# Patient Record
Sex: Female | Born: 1970 | Race: White | Hispanic: No | Marital: Married | Smoking: Never smoker
Health system: Southern US, Community
[De-identification: ages and names within clinical notes are randomized; demographics above are authoritative.]

---

## 2007-07-02 ENCOUNTER — Ambulatory Visit (HOSPITAL_COMMUNITY): Admission: RE | Admit: 2007-07-02 | Discharge: 2007-07-02 | Payer: Self-pay | Admitting: Obstetrics and Gynecology

## 2007-07-05 ENCOUNTER — Inpatient Hospital Stay (HOSPITAL_COMMUNITY): Admission: AD | Admit: 2007-07-05 | Discharge: 2007-07-05 | Payer: Self-pay | Admitting: *Deleted

## 2008-04-08 ENCOUNTER — Inpatient Hospital Stay (HOSPITAL_COMMUNITY): Admission: AD | Admit: 2008-04-08 | Discharge: 2008-04-08 | Payer: Self-pay | Admitting: Obstetrics and Gynecology

## 2008-04-21 ENCOUNTER — Inpatient Hospital Stay (HOSPITAL_COMMUNITY): Admission: RE | Admit: 2008-04-21 | Discharge: 2008-04-24 | Payer: Self-pay | Admitting: Obstetrics and Gynecology

## 2008-04-21 ENCOUNTER — Encounter (INDEPENDENT_AMBULATORY_CARE_PROVIDER_SITE_OTHER): Payer: Self-pay | Admitting: Obstetrics and Gynecology

## 2008-06-07 IMAGING — RF DG HYSTEROGRAM
6 series · 6 of 6 positions shown · IV contrast (omnipaque)
Comparison: No prior studies are available for comparison

Clinical Data  primary  infertility

HYSTEROSALPINGOGRAM
TECHNIQUE: Following cleansing of the cervix and vagina with
Betadine solution, a hysterosalpingogram was performed using a 5-
French hysterosalpingogram catheter and Omnipaque 300 contrast.

[Series 1: run · 1 of 1 slices shown (1 of 6)]
[im 1/1]
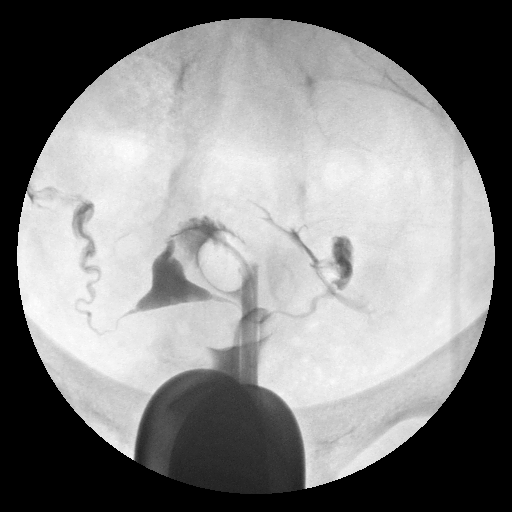

[Series 2: run · 1 of 1 slices shown (2 of 6)]
[im 1/1]
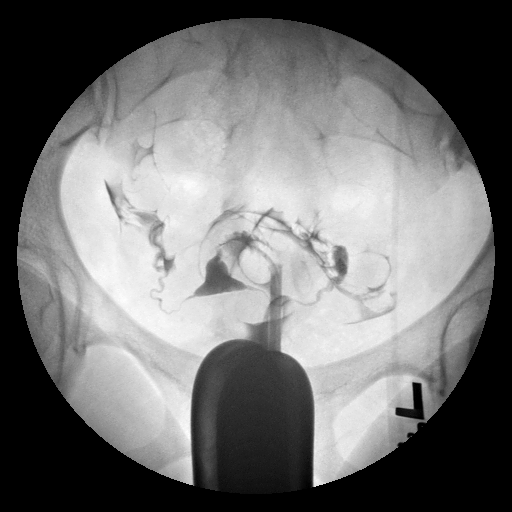

[Series 3: run · 1 of 1 slices shown (3 of 6)]
[im 1/1]
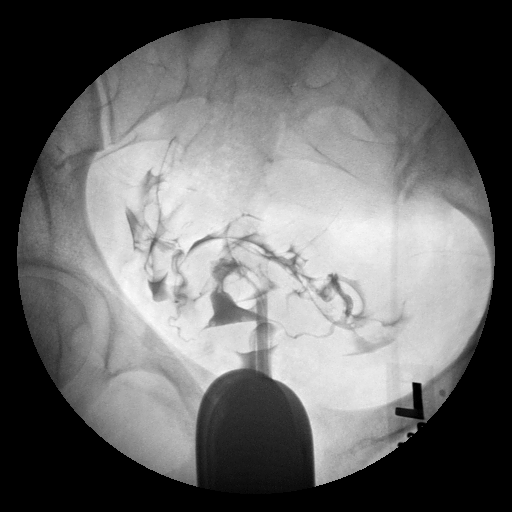

[Series 4: run · 1 of 1 slices shown (4 of 6)]
[im 1/1]
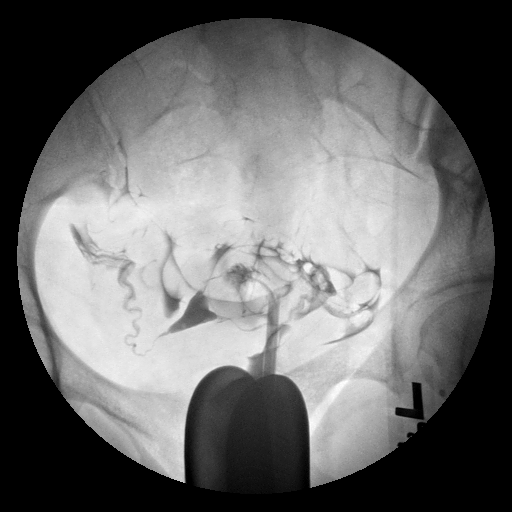

[Series 5: run · 1 of 1 slices shown (5 of 6)]
[im 1/1]
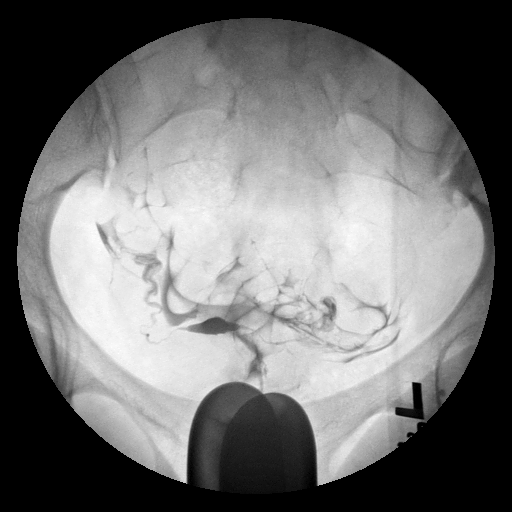

[Series 6: run · 1 of 1 slices shown (6 of 6)]
[im 1/1]
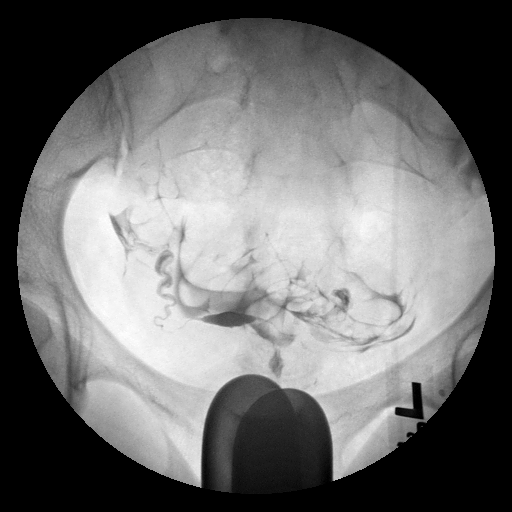

[6 of 6 positions shown; findings below may reference images not displayed]

FINDINGS: A normal endometrial morphology is seen.  Both fallopian
tubes have a normal appearance and bilateral free intraperitoneal
spill was noted.  No evidence for loculation of contrast was seen
to suggest the presence of peritubal or  periovarian adhesions.
IMPRESSION: Normal HSG

## 2010-07-09 ENCOUNTER — Encounter (HOSPITAL_COMMUNITY)
Admission: RE | Admit: 2010-07-09 | Discharge: 2010-07-09 | Disposition: A | Discharge status: Home / Self Care | Payer: BC Managed Care – PPO | Source: Ambulatory Visit | Attending: Obstetrics and Gynecology | Admitting: Obstetrics and Gynecology

## 2010-07-09 LAB — CBC
Hemoglobin: 12.5 g/dL (ref 12.0–15.0)
Platelets: 184 10*3/uL (ref 150–400)
RBC: 4.02 MIL/uL (ref 3.87–5.11)

## 2010-07-09 LAB — RPR: RPR Ser Ql: NONREACTIVE

## 2010-07-10 LAB — SURGICAL PCR SCREEN: Staphylococcus aureus: NEGATIVE

## 2010-07-16 ENCOUNTER — Inpatient Hospital Stay (HOSPITAL_COMMUNITY)
Admission: RE | Admit: 2010-07-16 | Discharge: 2010-07-18 | DRG: 371 | Disposition: A | Discharge status: Home / Self Care | Payer: BC Managed Care – PPO | Source: Ambulatory Visit | Attending: Obstetrics and Gynecology | Admitting: Obstetrics and Gynecology

## 2010-07-16 DIAGNOSIS — O34219 Maternal care for unspecified type scar from previous cesarean delivery: Principal | ICD-10-CM | POA: Diagnosis present

## 2010-07-16 DIAGNOSIS — O09529 Supervision of elderly multigravida, unspecified trimester: Secondary | ICD-10-CM | POA: Diagnosis present

## 2010-07-16 DIAGNOSIS — Z302 Encounter for sterilization: Secondary | ICD-10-CM

## 2010-07-17 LAB — CBC
MCH: 30.9 pg (ref 26.0–34.0)
Platelets: 153 10*3/uL (ref 150–400)
RBC: 3.53 MIL/uL — ABNORMAL LOW (ref 3.87–5.11)
RDW: 13 % (ref 11.5–15.5)
WBC: 10.2 10*3/uL (ref 4.0–10.5)

## 2010-07-17 LAB — ABO/RH: ABO/RH(D): O POS

## 2010-07-23 LAB — RPR: RPR Ser Ql: NONREACTIVE

## 2010-07-23 LAB — CBC
Hemoglobin: 12.5 g/dL (ref 12.0–15.0)
MCHC: 34.6 g/dL (ref 30.0–36.0)
MCV: 95.8 fL (ref 78.0–100.0)
Platelets: 152 10*3/uL (ref 150–400)
RBC: 3.82 MIL/uL — ABNORMAL LOW (ref 3.87–5.11)
RBC: 2.57 MIL/uL — ABNORMAL LOW (ref 3.87–5.11)
RDW: 12.5 % (ref 11.5–15.5)
WBC: 15.5 10*3/uL — ABNORMAL HIGH (ref 4.0–10.5)

## 2010-07-23 LAB — GLUCOSE, CAPILLARY: Glucose-Capillary: 97 mg/dL (ref 70–99)

## 2010-07-27 NOTE — Discharge Summary (Signed)
  NAMESUBRINA, Walters            ACCOUNT NO.:  [id number]  MEDICAL RECORD NO.:  [id number]           PATIENT TYPE:  I  LOCATION:  9106                          [facility]ACILITY[facility]  [facility]  PHYSICIAN:  Lenoard Aden, M.D.DATE OF BIRTH:  [birth year]  DATE OF ADMISSION:  07/16/2010 DATE OF DISCHARGE:  07/19/2010                              DISCHARGE SUMMARY   ADMITTING DIAGNOSES:  65 weeks' gestation, previous cesarean section, undesired fertility, elective repeat cesarean section.  DISCHARGE DIAGNOSES:  Postoperative day #2 status post repeat cesarean section, lysis of adhesions, and tubal sterilization with Filshie clips.  HISTORY:  The patient is a 40 year old gravida 2, para 1 with an EDC of July 23, 2010.  The patient has received prenatal care at [facility] with Dr. Billy Walters as primary since 8 weeks' gestation.  PRENATAL LABS:  Blood type O positive, rubella positive, HIV negative, RPR negative, hepatitis B negative, normal GTT.  Pregnancy is complicated by previous cesarean section for failure to progress and a single kidney with recurrent UTIs.  The patient has a known allergy to PERCOCET and DOXYCYCLINE.  CURRENT MEDICATIONS:  Prenatal vitamin 1 tablet p.o. daily and Tylenol 1- 2 tablets p.r.n. q.4 h. as needed for discomfort.  MEDICAL HISTORY:  Significant for a single kidney, history of nephrectomy as an infant, recurrent UTIs, infertility.  PRESENTATION:  The patient presents on day of admission for repeat cesarean section and tubal sterilization.  Preoperative CBC; white blood cell count 9.3, hemoglobin 12.5, hematocrit 36.4, and platelet count of 184.    PROCEDURE: The patient undergoes cesarean section attended by Dr. Billy Walters with delivery of a female 7 pounds 7 ounces, Apgar scores of 8 and 9, lysis of adhesions, tubal sterilization with Filshie clips secondary to tubes to uterine wall.  See operative report for further details.   POSTOPERATIVE COURSE: The  patient's postoperative course was uneventful.  Postoperative CBC; white blood cell count 10.2, hemoglobin 10.9, hematocrit 32.5, and platelet count of 153. Physical exam was within normal limits.  Incision is well approximated with staples. No erythema, no ecchymosis, and no drainage. Vital signs were stable. The patient remains afebrile during hospitalization.  The patient is discharged home on postoperative day #2 in stable condition.  DISCHARGE INSTRUCTIONS:  Postpartum instructions per [facility] booklet.  ACTIVITY:  Postoperative restrictions x2 weeks.  DIET:  Regular.  MEDICATIONS:  Prenatal vitamin 1 tablet p.o. daily, ibuprofen 800 mg every 8 hours as needed for discomfort, and Ultram 25 mg 1-2 tablets every 4 hours as needed for pain.  The patient to return to [facility] on Friday, July 20, 2010, for staple removal and at 6 weeks for routine postpartum care.     Marlinda Mike, C.N.M.   ______________________________ Lenoard Aden, M.D.    TB/MEDQ  D:  07/18/2010  T:  07/19/2010  Job:  [id number]  Electronically Signed by Marlinda Mike C.N.M. on 07/25/2010 09:31:13 AM Electronically Signed by Olivia Mackie M.D. on 07/27/2010 03:18:29 PM

## 2010-07-27 NOTE — Op Note (Signed)
Emily Walters, Emily Walters            ACCOUNT NO.:  [id number]  MEDICAL RECORD NO.:  [id number]           PATIENT TYPE:  I  LOCATION:  9106                          [facility]ILITY[facility]  PHYSICIAN:  Lenoard Aden, M.D.DATE OF BIRTH:  1970/10/22  DATE OF PROCEDURE:  07/16/2010 DATE OF DISCHARGE:                              OPERATIVE REPORT   PREOPERATIVE DIAGNOSES: 1. A 39-week intrauterine pregnancy. 2. Previous C-sections, for repeat. 3. Elective sterilization.  POSTOPERATIVE DIAGNOSES: 1. A 39-week intrauterine pregnancy. 2. Previous C-sections, for repeat. 3. Elective sterilization. 4. Dense pelvic adhesions.  PROCEDURES: 1. Repeat low segment transverse cesarean section. 2. Lysis of adhesions. 3. Tubal ligation with Filshie clips.  SURGEON:  Lenoard Aden, MD  ASSISTANT:  Marlinda Mike, CNM  ANESTHESIA:  Spinal by Dr. Brayton Caves.  ESTIMATED BLOOD LOSS:  800 mL.  COMPLICATIONS:  None.  DRAINS:  Foley. COUNTS:  Correct.  DISPOSITION:  The patient to recovery in good condition.  BRIEF OPERATIVE NOTE:  After being apprised of the risks of anesthesia, infection, bleeding, injury of abdominal organs, possible need for repair, delayed versus immediate complications to include bowel and bladder injury with possible need for repair, failure risk of tubal ligation of 5-10 per 1000.  The patient was brought to the operating room where she administered a spinal anesthetic without complications. She was prepped and draped in the usual sterile fashion.  Foley catheter was placed.  After achieving adequate anesthesia, a time-out was accomplished in a standard fashion and dilute Marcaine solution was placed.  Pfannenstiel skin was incision made with a scalpel and carried down to the fascia which nicked in the midline and opened transversely using Mayo scissors.  Upon opening the fascia, it was noted that there is a significant rectus diastasis with adhesions of the  bladder flap and uterus to the anterior abdominal wall.  These were lysed sharply using Metzenbaum scissors and bladder blade was then placed and bladder flap was then further dissected off the lower uterine segment.  Upon further dissection, there was a lower uterine segment window which was noted upon dissection of the bladder flap.  Bladder flap was more meticulously dissected off this lower uterine segment caudad to the area where the window was and then the window was extended using bandage scissors, amniotomy clear fluid, atraumatic delivery of a full-term living female as noted, Apgars as noted.  Placenta was delivered from posterior location intact and exterior uterus was exteriorized and curetted using dry lap pack and closed in one running imbricating layer of 0 Monocryl suture. Bladder flap was inspected and found to be hemostatic.  Irrigation was accomplished.  Urine output was adequate.  Urine was clear.  Both tubes were significantly adherent to the lower uterine segment such that a well-defined portion of the mesosalpinx was not seen on the infundibular portion of the tube.  There were small windows that appeared closer to the isthmic portion of the tube into the ampullary section.  These ampullary isthmic portions of the tube were identified, where a small window of the mesosalpinx was noted.  Filshie clips were placed across the tubes at both ampullary  isthmic portions without difficulty.  Good hemostasis was noted.  Both ovaries appeared normal.  Good hemostasis along the lower uterine incision was noted.  Bladder flap was once again inspected and found to be hemostatic.  Irrigation was accomplished and uterus was replaced in the abdominal cavity.  The fascia was then closed using 0 Monocryl in a continuous running fashion.  Subcutaneous tissues were reapproximated using 0 plain in a continuous running fashion.  Skin was closed using staples.  The patient tolerated the  procedure well and was transferred to recovery in good condition.     Lenoard Aden, M.D.     RJT/MEDQ  D:  07/16/2010  T:  07/17/2010  Job:  [id number]  Electronically Signed by Olivia Mackie M.D. on 07/27/2010 04:54:09 PM

## 2010-07-27 NOTE — H&P (Addendum)
  Emily Walters, Emily Walters            ACCOUNT NO.:  [id number]  MEDICAL RECORD NO.:  [id number]           PATIENT TYPE:  O  LOCATION:  SDC                           [facility]  PHYSICIAN:  Lenoard Aden, M.D.DATE OF BIRTH:  [birth year]  DATE OF ADMISSION:  07/09/2010 DATE OF DISCHARGE:  07/09/2010                             HISTORY & PHYSICAL   CHIEF COMPLAINT:  Previous C-section repeat at 39 weeks.  HISTORY OF PRESENT ILLNESS:  She is a 40 year old white female G 2, P1 history of previous C-section in 2010 who presents now for elective repeat C-section at 39 weeks with tubal ligation.  MEDICATIONS:  Prenatal vitamins, Mucinex as needed, Claritin as needed.  ALLERGIES:  PERCOCET, DOXYCYCLINE, and no latex allergies.  FAMILY HISTORY:  Stroke, heart disease, kidney cancer, depression, breast cancer, diabetes.  SOCIAL HISTORY:  Noncontributory.  She is a nonsmoker, nondrinker.  She denies domestic or physical violence.  SURGICAL HISTORY:  She had a primary C-section for a failed induction in 2010.  She has a history of Achilles tendon repair in 1988.  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  Her renal function tests during pregnancy have remained normal with a normal 24-hour urine.  PHYSICAL EXAMINATION:  VITAL SIGNS:  She has a height of 5 feet 3/4, weight of 210 pounds, blood pressure 118/70. HEENT:  Normal. NECK:  Supple.  Full range of motion. LUNGS:  Clear. HEART:  Regular rate and rhythm. ABDOMEN:  Soft, gravid, nontender.  No CVA tenderness. GU:  Cervix is closed, 70% vertex -1. EXTREMITIES:  No cords. NEUROLOGIC:  Nonfocal. SKIN:  Intact.  IMPRESSION: 1. A 39-week intrauterine pregnancy. 2. Previous cesarean section for elective repeat tubal ligation.  PLAN:  Proceed with elective repeat C-section and tubal ligation.  Risks of anesthesia, infection, bleeding, injury to intraabdominal organs and need  for repair was discussed.  Delayed versus immediate complications to include bowel and bladder injury noted.  Fair risk with tubal ligation of __________ was also noted.  The patient acknowledges and will proceed.     Lenoard Aden, M.D.     RJT/MEDQ  D:  07/14/2010  T:  07/14/2010  Job:  [id number]  cc:   Ma Hillock  Electronically Signed by Olivia Mackie M.D. on 07/27/2010 03:18:12 PM

## 2010-08-07 ENCOUNTER — Inpatient Hospital Stay (HOSPITAL_COMMUNITY): Admission: AD | Admit: 2010-08-07 | Payer: Self-pay | Source: Home / Self Care | Admitting: Obstetrics and Gynecology

## 2010-08-21 NOTE — H&P (Signed)
Emily Walters, Emily Walters            ACCOUNT NO.:  [id number]   MEDICAL RECORD NO.:  [id number]          PATIENT TYPE:  INP   LOCATION:  9161                          [facility]   PHYSICIAN:  Lenoard Aden, M.D.DATE OF BIRTH:  20-Feb-1971   DATE OF ADMISSION:  04/21/2008  DATE OF DISCHARGE:                              HISTORY & PHYSICAL   CHIEF COMPLAINT:  A 39 weeks with oligohydramnios and an AFI of 5.   She is a 40 year old white female G1, P0 at 39 plus weeks with an AFI of  5 as seen on ultrasound who presents with a favorable cervix for  induction at 39 weeks appropriate for gestational age fetus.   ALLERGIES:  She has no known drug allergies.   MEDICATIONS:  Prenatal vitamins.   SOCIAL HISTORY:  She is a nonsmoker, nondrinker.  She denies domestic  physical violence.   FAMILY HISTORY:  Depression, stroke, diabetes, breast and kidney cancer,  heart disease, chronic hypertension.  This is her first pregnancy.  GBS  was negative.   PHYSICAL EXAMINATION:  GENERAL:  She is a well-developed, well-nourished  white female in no acute distress.  HEENT:  Normal.  LUNGS:  Clear.  HEART:  Regular rhythm.  ABDOMEN:  Soft, gravid, and nontender.  Estimated fetal weight 7-1/2  pounds, cervix is 2-3, 80% vertex, -1.  EXTREMITIES:  There are no cords.  NEUROLOGIC:  Nonfocal.  SKIN:  Intact.   IMPRESSION:  Oligohydramnios at term with favorable cervix for  induction.   PLAN:  To proceed with Pitocin, epidural as needed.      Lenoard Aden, M.D.  Electronically Signed     RJT/MEDQ  D:  04/21/2008  T:  04/21/2008  Job:  [id number]

## 2010-08-21 NOTE — Op Note (Signed)
NAMEJEANNEMARIE, Emily Walters            ACCOUNT NO.:  [id number]   MEDICAL RECORD NO.:  [id number]          PATIENT TYPE:  INP   LOCATION:  9102                          [facility]   PHYSICIAN:  Lenoard Aden, M.D.DATE OF BIRTH:  [birth year]   DATE OF PROCEDURE:  DATE OF DISCHARGE:                               OPERATIVE REPORT   PREOPERATIVE DIAGNOSIS:  Active phase arrest.   POSTOPERATIVE DIAGNOSES:  Active phase arrest plus occiput posterior  position.   PROCEDURE:  Primary low-segment transverse cesarean section.   SURGEON:  Lenoard Aden, MD   ANESTHESIA:  Epidural by Hatchett.   ESTIMATED BLOOD LOSS:  1000 mL.   COMPLICATIONS:  None.   DRAINS:  Foley.   COUNTS:  Correct.   The patient to recovery in good condition.   BRIEF OPERATIVE NOTE:  After being apprised of risks of anesthesia,  infection, bleeding, injury to abdominal organs, need for repair,  delayed versus immediate complications to include bowel and bladder  injury, the patient was brought to the operating room where she was  administered a dosing of epidural anesthetic without complications.  She  was prepped and draped in usual sterile fashion.  Foley catheter placed.  After achieving adequate anesthesia, dilute Marcaine solution was  placed.  Pfannenstiel skin incision made with a scalpel, carried down to  fascia, which was nicked in midline and opened transversely using Mayo  scissors.  Rectus muscles dissected sharply in the midline, peritoneum  entered sharply.  Bladder blade placed.  Visceral peritoneum scored  sharply off the lower uterine segment.  Kerr hysterotomy incision was  made.  Atraumatic delivery.  Thin meconium.  Occiput posterior position,  6 pounds 4 ounces female, pediatrician in attendance, partial anterior  placental abruption noted.  Placenta delivered manually intact.  Uterus  exteriorized, curetted using a dry lap pack and closed in 2 running  imbricating layers of  0-Monocryl suture with multiple interrupted  sutures in the midline for hemostasis.  Bladder flap inspected  and found to be hemostatic.  Irrigation accomplished.  Fascia closed  using 0-Monocryl in a continuous running fashion.  Subcutaneous tissue  reapproximated using a 2-0 plain in a continuous running fashion.  Skin  closed using staples.  The patient tolerated the procedure well and was  transferred to recovery in good condition.      Lenoard Aden, M.D.  Electronically Signed     RJT/MEDQ  D:  04/22/2008  T:  04/22/2008  Job:  [id number]

## 2010-08-24 NOTE — Discharge Summary (Signed)
NAMEDANNAH, Emily Walters            ACCOUNT NO.:  [id number]   MEDICAL RECORD NO.:  [id number]          PATIENT TYPE:  INP   LOCATION:  9102                          [facility]   PHYSICIAN:  Lenoard Aden, M.D.DATE OF BIRTH:  1970/12/23   DATE OF ADMISSION:  04/21/2008  DATE OF DISCHARGE:  04/24/2008                               DISCHARGE SUMMARY   HOSPITAL COURSE:  The patient underwent uncomplicated primary low  segment transverse Cesarean section on April 21, 2008.  Postoperative  course uncomplicated.  Tolerated diet well.  Discharged to home on  hospital day #3.  Tylox, prenatal vitamins and iron given.  Followup in  the office in 4 to 6 weeks.  Discharge teaching done.      Lenoard Aden, M.D.  Electronically Signed     RJT/MEDQ  D:  05/31/2008  T:  05/31/2008  Job:  [id number]

## 2010-08-29 ENCOUNTER — Other Ambulatory Visit: Payer: Self-pay | Admitting: Obstetrics and Gynecology

## 2011-08-06 ENCOUNTER — Other Ambulatory Visit: Payer: Self-pay | Admitting: Obstetrics and Gynecology

## 2011-08-06 DIAGNOSIS — Z1231 Encounter for screening mammogram for malignant neoplasm of breast: Secondary | ICD-10-CM

## 2011-08-20 ENCOUNTER — Ambulatory Visit
Admission: RE | Admit: 2011-08-20 | Discharge: 2011-08-20 | Disposition: A | Discharge status: Home / Self Care | Payer: BC Managed Care – PPO | Source: Ambulatory Visit | Attending: Obstetrics and Gynecology | Admitting: Obstetrics and Gynecology

## 2011-08-20 DIAGNOSIS — Z1231 Encounter for screening mammogram for malignant neoplasm of breast: Secondary | ICD-10-CM

## 2011-09-09 ENCOUNTER — Ambulatory Visit: Payer: BC Managed Care – PPO

## 2012-07-13 ENCOUNTER — Other Ambulatory Visit: Payer: Self-pay

## 2012-07-13 DIAGNOSIS — Z1231 Encounter for screening mammogram for malignant neoplasm of breast: Secondary | ICD-10-CM

## 2012-08-21 ENCOUNTER — Ambulatory Visit: Payer: BC Managed Care – PPO

## 2012-12-15 ENCOUNTER — Ambulatory Visit: Payer: BC Managed Care – PPO

## 2013-10-18 DIAGNOSIS — K589 Irritable bowel syndrome without diarrhea: Secondary | ICD-10-CM | POA: Insufficient documentation

## 2013-10-18 DIAGNOSIS — L709 Acne, unspecified: Secondary | ICD-10-CM | POA: Insufficient documentation

## 2013-10-18 DIAGNOSIS — E669 Obesity, unspecified: Secondary | ICD-10-CM | POA: Insufficient documentation

## 2013-12-21 DIAGNOSIS — M5412 Radiculopathy, cervical region: Secondary | ICD-10-CM | POA: Insufficient documentation

## 2013-12-21 DIAGNOSIS — M7918 Myalgia, other site: Secondary | ICD-10-CM | POA: Insufficient documentation

## 2014-03-10 DIAGNOSIS — J302 Other seasonal allergic rhinitis: Secondary | ICD-10-CM | POA: Insufficient documentation

## 2014-03-10 DIAGNOSIS — F4321 Adjustment disorder with depressed mood: Secondary | ICD-10-CM | POA: Insufficient documentation

## 2014-09-27 ENCOUNTER — Ambulatory Visit: Payer: Self-pay | Admitting: Podiatry

## 2016-12-04 ENCOUNTER — Ambulatory Visit (INDEPENDENT_AMBULATORY_CARE_PROVIDER_SITE_OTHER): Payer: BLUE CROSS/BLUE SHIELD

## 2016-12-04 ENCOUNTER — Ambulatory Visit (INDEPENDENT_AMBULATORY_CARE_PROVIDER_SITE_OTHER): Payer: BLUE CROSS/BLUE SHIELD | Admitting: Podiatry

## 2016-12-04 ENCOUNTER — Encounter: Payer: Self-pay | Admitting: Podiatry

## 2016-12-04 VITALS — BP 128/80 | HR 87 | Resp 16

## 2016-12-04 DIAGNOSIS — M722 Plantar fascial fibromatosis: Secondary | ICD-10-CM

## 2016-12-04 NOTE — Progress Notes (Signed)
   Subjective:    Patient ID: Emily AndreasAshley M Vorndran, female    DOB: 12/13/1970, 46 y.o.   MRN: [id number]  HPI Chief Complaint  Patient presents with  . Foot Pain    Left foot; bottom of heel; pt stated, "Wants to discuss getting EPAT done; has seen a Podiatrist in [place] - Jean RosenthalChristine Wright and they referred over to here for this procedure"      Review of Systems  All other systems reviewed and are negative.      Objective:   Physical Exam        Assessment & Plan:

## 2016-12-04 NOTE — Patient Instructions (Signed)

## 2016-12-04 NOTE — Progress Notes (Signed)
Subjective:    Patient ID: Emily Walters, female   DOB: 46 y.o.   MRN: [id number]   HPI patient states that she has chronic pain in her left heel and has a history of having shockwave on one of her heels a number of years ago. States the pain has been quite intense and she's gone through numerous conservative treatments    Review of Systems  All other systems reviewed and are negative.       Objective:  Physical Exam  Constitutional: She appears well-developed and well-nourished.  Cardiovascular: Intact distal pulses.   Pulmonary/Chest: Breath sounds normal.  Musculoskeletal: Normal range of motion.  Neurological: She is alert.  Skin: Skin is warm.  Nursing note and vitals reviewed.  neurovascular status found be intact with muscle strength adequate range of motion within normal limits. 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 At this point I do think patient would do best with shockwave therapy and I discussed shockwave therapy for this condition going over the pros and cons of it. Patient wants this procedure and is scheduled for shockwave and at this time I did go ahead and I dispensed air fracture walker to begin wearing prior to the procedure and to try to reduce the inflammation before surgery. I also discussed long-term orthotics which I would like to do when she has recovered from shockwave

## 2016-12-12 ENCOUNTER — Ambulatory Visit (INDEPENDENT_AMBULATORY_CARE_PROVIDER_SITE_OTHER): Payer: Self-pay | Admitting: Podiatry

## 2016-12-12 DIAGNOSIS — M722 Plantar fascial fibromatosis: Secondary | ICD-10-CM

## 2016-12-13 NOTE — Progress Notes (Signed)
Patient presents with plantar fascial pain in her Lt foot. She states that she has tried various treatments with no relief.   Pain on palpation to medial heel band 5 of 10  ESWT administered at 4 joules to plantar fascia at medial heel site. EPAT delivered to entire plantar fascia. She is to follow up in 2 weeks for 2nd treatment. Advised on boot usage and to avoid NSAIDs and ice

## 2016-12-25 ENCOUNTER — Ambulatory Visit (INDEPENDENT_AMBULATORY_CARE_PROVIDER_SITE_OTHER): Payer: BLUE CROSS/BLUE SHIELD | Admitting: Podiatry

## 2016-12-25 DIAGNOSIS — M722 Plantar fascial fibromatosis: Secondary | ICD-10-CM

## 2016-12-25 NOTE — Progress Notes (Signed)
Patient presents with plantar fascial pain in her Lt foot. She states that she has tried various treatments with no relief. She says that she has noticed a great improvement in her heel [ain so far.  Pain on palpation to medial heel band 5 of 10  ESWT administered at 8 joules to plantar fascia at medial heel site. EPAT delivered to entire plantar fascia. She is to follow up in 2 weeks for 3rd treatment. Advised on boot usage and to avoid NSAIDs and ice

## 2017-01-02 ENCOUNTER — Ambulatory Visit: Payer: BLUE CROSS/BLUE SHIELD

## 2017-01-07 ENCOUNTER — Ambulatory Visit: Payer: BLUE CROSS/BLUE SHIELD

## 2017-01-07 DIAGNOSIS — B351 Tinea unguium: Secondary | ICD-10-CM

## 2017-02-11 ENCOUNTER — Ambulatory Visit: Payer: BLUE CROSS/BLUE SHIELD

## 2017-02-21 ENCOUNTER — Ambulatory Visit (INDEPENDENT_AMBULATORY_CARE_PROVIDER_SITE_OTHER): Payer: BLUE CROSS/BLUE SHIELD | Admitting: Podiatry

## 2017-02-21 DIAGNOSIS — M722 Plantar fascial fibromatosis: Secondary | ICD-10-CM

## 2017-02-24 NOTE — Progress Notes (Signed)
Patient presents with plantar fascial pain in her Lt foot. She states that she has tried various treatments with no relief. She says that she has noticed a great improvement in her heel [ain so far.  Pain on palpation to medial heel band 5 of 10  ESWT administered at 10 joules to plantar fascia at medial heel site. EPAT delivered to entire plantar fascia. She is to follow up in 6 weeks with Dr Charlsie Merlesegal

## 2017-04-10 DIAGNOSIS — F411 Generalized anxiety disorder: Secondary | ICD-10-CM | POA: Insufficient documentation

## 2017-04-11 ENCOUNTER — Encounter: Payer: BLUE CROSS/BLUE SHIELD | Admitting: Podiatry

## 2017-04-11 NOTE — Patient Instructions (Signed)

## 2017-05-08 DIAGNOSIS — J0110 Acute frontal sinusitis, unspecified: Secondary | ICD-10-CM | POA: Insufficient documentation

## 2017-08-21 NOTE — Progress Notes (Signed)
This encounter was created in error - please disregard.

## 2017-12-15 ENCOUNTER — Other Ambulatory Visit: Payer: Self-pay | Admitting: Radiology

## 2019-04-14 ENCOUNTER — Ambulatory Visit: Payer: BC Managed Care – PPO | Attending: Internal Medicine

## 2019-04-14 DIAGNOSIS — Z20822 Contact with and (suspected) exposure to covid-19: Secondary | ICD-10-CM

## 2019-04-16 LAB — NOVEL CORONAVIRUS, NAA: SARS-CoV-2, NAA: DETECTED — AB

## 2019-09-13 ENCOUNTER — Ambulatory Visit (INDEPENDENT_AMBULATORY_CARE_PROVIDER_SITE_OTHER): Payer: BC Managed Care – PPO | Admitting: Podiatry

## 2019-09-13 ENCOUNTER — Encounter: Payer: Self-pay | Admitting: Podiatry

## 2019-09-13 ENCOUNTER — Ambulatory Visit (INDEPENDENT_AMBULATORY_CARE_PROVIDER_SITE_OTHER): Payer: BC Managed Care – PPO

## 2019-09-13 ENCOUNTER — Other Ambulatory Visit: Payer: Self-pay

## 2019-09-13 VITALS — Temp 98.1°F

## 2019-09-13 DIAGNOSIS — M722 Plantar fascial fibromatosis: Secondary | ICD-10-CM

## 2019-09-13 DIAGNOSIS — M79671 Pain in right foot: Secondary | ICD-10-CM

## 2019-09-13 NOTE — Patient Instructions (Signed)

## 2019-09-13 NOTE — Progress Notes (Signed)
Subjective:   Patient ID: Emily Walters, female   DOB: 49 y.o.   MRN: [id number]   HPI Patient presents stating she has a lot of pain in the bottom of the right heel but it seems more on the outside than the inside and has had successful shockwave in the past and may need that again   ROS      Objective:  Physical Exam  Neurovascular status intact with patient found to have exquisite discomfort in the plantar fascial right more in the lateral band moderate center and not bad into the medial band of the fascia     Assessment:  Acute plantar fasciitis right with inflammation     Plan:  Micah Flesher ahead did a sterile prep injected from the lateral side 3 mg Kenalog 5 mg Xylocaine reviewed x-ray advised on shoes with elevated heel and reappoint to recheck  X-ray indicates that there is small spur no indication stress fracture or arthritis

## 2019-09-14 ENCOUNTER — Other Ambulatory Visit: Payer: Self-pay | Admitting: Podiatry

## 2019-09-14 DIAGNOSIS — M722 Plantar fascial fibromatosis: Secondary | ICD-10-CM

## 2019-10-06 ENCOUNTER — Other Ambulatory Visit: Payer: Self-pay

## 2019-10-06 ENCOUNTER — Encounter: Payer: Self-pay | Admitting: Podiatry

## 2019-10-06 ENCOUNTER — Ambulatory Visit (INDEPENDENT_AMBULATORY_CARE_PROVIDER_SITE_OTHER): Payer: BC Managed Care – PPO | Admitting: Podiatry

## 2019-10-06 DIAGNOSIS — M722 Plantar fascial fibromatosis: Secondary | ICD-10-CM

## 2019-10-06 NOTE — Progress Notes (Signed)
Subjective:   Patient ID: Emily Walters, female   DOB: 49 y.o.   MRN: [id number]   HPI Patient presents stating that the medication gave her temporary relief and she is still having quite a bit of pain but mild improvement.  Hurts across the bottom of the heel mostly on the lateral side   ROS      Objective:  Physical Exam  Plantar fasciitis that has remained resistant right with long-term history with history of shockwave on the left     Assessment:  Pain still noted plantar heel right over left     Plan:  Reviewed condition discussed treatment options and at this point we are can initiate shockwave therapy to the plantar medial lateral right heel along with stretching the gastroc.  Patient is scheduled with Emily Walters for this procedure and will have done in the next several weeks

## 2019-11-12 ENCOUNTER — Other Ambulatory Visit: Payer: BC Managed Care – PPO

## 2019-11-17 ENCOUNTER — Ambulatory Visit (INDEPENDENT_AMBULATORY_CARE_PROVIDER_SITE_OTHER): Payer: BC Managed Care – PPO

## 2019-11-17 ENCOUNTER — Other Ambulatory Visit: Payer: Self-pay | Admitting: Podiatry

## 2019-11-17 ENCOUNTER — Ambulatory Visit (INDEPENDENT_AMBULATORY_CARE_PROVIDER_SITE_OTHER): Payer: BC Managed Care – PPO | Admitting: Podiatry

## 2019-11-17 ENCOUNTER — Encounter: Payer: Self-pay | Admitting: Podiatry

## 2019-11-17 ENCOUNTER — Other Ambulatory Visit: Payer: Self-pay

## 2019-11-17 DIAGNOSIS — M7661 Achilles tendinitis, right leg: Secondary | ICD-10-CM | POA: Diagnosis not present

## 2019-11-17 DIAGNOSIS — M79671 Pain in right foot: Secondary | ICD-10-CM | POA: Diagnosis not present

## 2019-11-17 NOTE — Progress Notes (Signed)
Subjective:   Patient ID: Emily Walters, female   DOB: 49 y.o.   MRN: [id number]   HPI Patient is getting ready to start shockwave therapy for plantar heel and had a injury a week ago and developed pain in the back of the right heel.  Patient states it is very sore at the insertion   ROS      Objective:  Physical Exam  Neurovascular status intact with posterior pain of the right heel medial side with inflammation noted     Assessment:  Achilles tendinitis right medial side acute in nature with plantar fasciitis chronic     Plan:  H&P reviewed condition and discussed careful injection explaining risk of injection and rupture.  Today I did a sterile prep of the medial side carefully injected 3 mg dexamethasone 5 mg Xylocaine and then went ahead and applied air fracture walker that she has and she may begin shockwave therapy in the next couple weeks

## 2019-11-19 ENCOUNTER — Other Ambulatory Visit: Payer: BC Managed Care – PPO

## 2019-11-24 ENCOUNTER — Ambulatory Visit: Payer: BC Managed Care – PPO | Admitting: Podiatry

## 2019-11-29 ENCOUNTER — Other Ambulatory Visit: Payer: BC Managed Care – PPO

## 2019-12-10 ENCOUNTER — Ambulatory Visit (INDEPENDENT_AMBULATORY_CARE_PROVIDER_SITE_OTHER): Payer: Self-pay | Admitting: *Deleted

## 2019-12-10 ENCOUNTER — Other Ambulatory Visit: Payer: Self-pay

## 2019-12-10 DIAGNOSIS — B351 Tinea unguium: Secondary | ICD-10-CM

## 2019-12-10 DIAGNOSIS — M7661 Achilles tendinitis, right leg: Secondary | ICD-10-CM

## 2019-12-10 DIAGNOSIS — M722 Plantar fascial fibromatosis: Secondary | ICD-10-CM

## 2019-12-10 NOTE — Progress Notes (Signed)
Patient presents for the 1st EPAT treatment today with complaint of posterior heel pain right. Diagnosed with achilles tendonitis by Dr. Charlsie Merles. This has been ongoing for several months. The patient has tried ice, stretching, NSAIDS and supportive shoe gear with no long term relief.   Most of the pain is located lateral and posterior heel. She has an area of swelling lateral achilles.  ESWT administered and tolerated well.Treatment settings initiated at:   Energy: 20  Ended treatment session today with 3000 shocks at the following settings:   Energy: 20  Frequency: 5.0  Joules: 19.62   Advised to avoid ice and NSAIDs throughout the treatment process and to utilize boot or supportive shoes for at least the next 3 days.  Follow up for 2nd treatment in 1 week which she already schedule for 9/20.

## 2019-12-10 NOTE — Patient Instructions (Signed)

## 2019-12-27 ENCOUNTER — Ambulatory Visit (INDEPENDENT_AMBULATORY_CARE_PROVIDER_SITE_OTHER): Payer: 59 | Admitting: *Deleted

## 2019-12-27 ENCOUNTER — Other Ambulatory Visit: Payer: Self-pay

## 2019-12-27 DIAGNOSIS — M7661 Achilles tendinitis, right leg: Secondary | ICD-10-CM

## 2019-12-27 NOTE — Progress Notes (Signed)
Patient presents for the 2nd EPAT treatment today with complaint of posterior heel pain right. Diagnosed with achilles tendonitis by Dr. Charlsie Merles. This has been ongoing for several months. The patient has tried ice, stretching, NSAIDS and supportive shoe gear with no long term relief.   Most of the pain is located lateral and posterior heel. The area of swelling to the lateral side of achilles is a little better. She hasn't had too much pain.  ESWT administered and tolerated well.Treatment settings initiated at:   Energy: 25  Ended treatment session today with 3000 shocks at the following settings:   Energy: 25  Frequency: 4.0  Joules: 24.52   Advised to avoid ice and NSAIDs throughout the treatment process and to utilize boot or supportive shoes for at least the next 3 days.  Follow up for 3rd treatment in 1 week which she already schedule for on 10/4.

## 2020-01-10 ENCOUNTER — Other Ambulatory Visit: Payer: Self-pay

## 2020-01-10 ENCOUNTER — Ambulatory Visit (INDEPENDENT_AMBULATORY_CARE_PROVIDER_SITE_OTHER): Payer: 59 | Admitting: *Deleted

## 2020-01-10 DIAGNOSIS — M722 Plantar fascial fibromatosis: Secondary | ICD-10-CM

## 2020-01-10 DIAGNOSIS — M7661 Achilles tendinitis, right leg: Secondary | ICD-10-CM

## 2020-01-10 DIAGNOSIS — B351 Tinea unguium: Secondary | ICD-10-CM

## 2020-01-10 NOTE — Progress Notes (Signed)
Patient presents for the 3rd EPAT treatment today with complaint of posterior and plantar heel pain right. Diagnosed with achilles tendonitis/plantar fasciitis by Dr. Charlsie Merles. This has been ongoing for several months. The patient has tried ice, stretching, NSAIDS and supportive shoe gear with no long term relief.   Most of the pain today is located plantar heel and medial arch. She says that the posterior heel and lateral side of the achilles is feeling much better.  ESWT administered and tolerated well.Treatment settings initiated at:   Energy: 30  Ended treatment session today with 3000 shocks at the following settings:   Energy: 30  Frequency: 4.0  Joules: 29.43   Advised to avoid ice and NSAIDs throughout the treatment process and to utilize boot or supportive shoes for at least the next 3 days.  Follow up for 4th treatment in 2 weeks which she already schedule for on 10/22.

## 2020-01-28 ENCOUNTER — Other Ambulatory Visit: Payer: Self-pay

## 2020-01-28 ENCOUNTER — Ambulatory Visit (INDEPENDENT_AMBULATORY_CARE_PROVIDER_SITE_OTHER): Payer: 59 | Admitting: *Deleted

## 2020-01-28 DIAGNOSIS — M722 Plantar fascial fibromatosis: Secondary | ICD-10-CM

## 2020-01-28 DIAGNOSIS — B351 Tinea unguium: Secondary | ICD-10-CM

## 2020-01-28 NOTE — Progress Notes (Signed)
Patient presents for the 4th EPAT treatment today with complaint of posterior and plantar heel pain right. Diagnosed with achilles tendonitis/plantar fasciitis by Dr. Charlsie Merles. This has been ongoing for several months. The patient has tried ice, stretching, NSAIDS and supportive shoe gear with no long term relief.   Most of the pain today is in one pinpoint area of the posterior heel. Overall she feels that this treatment has helped and is doing much better.  ESWT administered and tolerated well.Treatment settings initiated at:   Energy: 35  Ended treatment session today with 3000 shocks at the following settings:   Energy: 35  Frequency: 4.0  Joules: 34.33   Advised to avoid ice and NSAIDs throughout the treatment process and to utilize boot or supportive shoes for at least the next 3 days.  She will follow up with Dr. Charlsie Merles in 2 weeks for re-evaluation.

## 2020-05-03 ENCOUNTER — Ambulatory Visit (INDEPENDENT_AMBULATORY_CARE_PROVIDER_SITE_OTHER): Payer: 59

## 2020-05-03 ENCOUNTER — Ambulatory Visit (INDEPENDENT_AMBULATORY_CARE_PROVIDER_SITE_OTHER): Payer: 59 | Admitting: Podiatry

## 2020-05-03 ENCOUNTER — Encounter: Payer: Self-pay | Admitting: Podiatry

## 2020-05-03 ENCOUNTER — Other Ambulatory Visit: Payer: Self-pay

## 2020-05-03 DIAGNOSIS — M7661 Achilles tendinitis, right leg: Secondary | ICD-10-CM

## 2020-05-03 DIAGNOSIS — M722 Plantar fascial fibromatosis: Secondary | ICD-10-CM | POA: Diagnosis not present

## 2020-05-03 MED ORDER — TRIAMCINOLONE ACETONIDE 10 MG/ML IJ SUSP
10.0000 mg | Freq: Once | INTRAMUSCULAR | Status: AC
  Administered 2020-05-03: 10 mg

## 2020-05-03 MED ORDER — MELOXICAM 15 MG PO TABS: 15.0000 mg | ORAL_TABLET | Freq: Every day | ORAL | 2 refills | Status: AC

## 2020-05-03 NOTE — Progress Notes (Signed)
Subjective:   Patient ID: Emily Walters, female   DOB: 50 y.o.   MRN: [id number]   HPI Patient presents stating that she is having still discomfort in her heel but it really is the back not the bottom and the bottom seems better after having shockwave therapy.  Patient states she is getting ready to go to [employer]   ROS      Objective:  Physical Exam  Neurovascular status intact with discomfort of the posterior medial heel Achilles insertion with mild discomfort also plantar fascial     Assessment:  Planter fasciitis doing pretty well with Achilles tendinitis still present right     Plan:  H&P reviewed condition and I did discuss 1 more careful injection explaining risk.  She wants to do this if she is desperate for relief prior to going to [facility] and understands risk and I did do a medial injection 3 mg Dexasone Kenalog 5 mg Xylocaine instructed on wearing her boot for the next few days along with ice and using topical medicines and I did place her on Mobic 15 mg daily.  Reappoint for [place] to recheck again in 4 weeks to reevaluate and may require other treatments and so far has not responded to shockwave therapy  X-rays indicate there is a small spur but I do not see an increase in the size over the last 6 months

## 2020-05-31 ENCOUNTER — Ambulatory Visit: Payer: 59 | Admitting: Podiatry

## 2023-09-08 ENCOUNTER — Ambulatory Visit (INDEPENDENT_AMBULATORY_CARE_PROVIDER_SITE_OTHER): Admitting: Podiatry

## 2023-09-08 ENCOUNTER — Encounter: Payer: Self-pay | Admitting: Podiatry

## 2023-09-08 ENCOUNTER — Ambulatory Visit (INDEPENDENT_AMBULATORY_CARE_PROVIDER_SITE_OTHER)

## 2023-09-08 DIAGNOSIS — M722 Plantar fascial fibromatosis: Secondary | ICD-10-CM | POA: Diagnosis not present

## 2023-09-08 DIAGNOSIS — M7752 Other enthesopathy of left foot: Secondary | ICD-10-CM | POA: Diagnosis not present

## 2023-09-08 MED ORDER — TRIAMCINOLONE ACETONIDE 10 MG/ML IJ SUSP
10.0000 mg | Freq: Once | INTRAMUSCULAR | Status: AC
  Administered 2023-09-08: 10 mg via INTRA_ARTICULAR

## 2023-09-08 MED ORDER — DICLOFENAC SODIUM 75 MG PO TBEC: 75.0000 mg | DELAYED_RELEASE_TABLET | Freq: Two times a day (BID) | ORAL | 2 refills | Status: DC

## 2023-09-08 NOTE — Progress Notes (Signed)
 Subjective:   Patient ID: Emily Walters, female   DOB: 53 y.o.   MRN: [id number]   HPI Patient presents with a lot of forefoot pain left over right that is been present for several months and the left started first followed by the right and getting ready to go out of town to [place].  States very hard to walk on this does not smoke likes to be active   Review of Systems  All other systems reviewed and are negative.       Objective:  Physical Exam Vitals and nursing note reviewed.  Constitutional:      Appearance: She is well-developed.  Pulmonary:     Effort: Pulmonary effort is normal.  Musculoskeletal:        General: Normal range of motion.  Skin:    General: Skin is warm.  Neurological:     Mental Status: She is alert.     Neurovascular status found to be intact muscle strength found to be adequate range of motion adequate with patient noted to have exquisite discomfort in the left second metatarsal phalangeal joint with fluid buildup and on the right moderate discomfort not to the same intensity.  Good digital perfusion well oriented x 3 significant history of Planter fasciitis which overalls been doing pretty well and managed by patient     Assessment:  Acute capsulitis left second MPJ over right with long-term history plantar fasciitis     Plan:  H&P reviewed organ to focus on the left foot first I did a forefoot block 60 mg like Marcaine mixture sterile prep to the joint aspirated the joint getting out a small amount of clear fluid injected quarter cc dexamethasone Kenalog  placed on diclofenac and padding bilateral and gave instructions on rigid bottom shoes  X-rays were negative for signs of fracture or arthritis around the second MPJ small spurs plantar heel bilateral

## 2023-09-25 ENCOUNTER — Ambulatory Visit: Admitting: Podiatry

## 2023-10-01 ENCOUNTER — Ambulatory Visit: Admitting: Podiatry

## 2024-02-25 ENCOUNTER — Encounter: Payer: Self-pay | Admitting: Podiatry

## 2024-02-25 ENCOUNTER — Ambulatory Visit

## 2024-02-25 ENCOUNTER — Other Ambulatory Visit: Payer: Self-pay | Admitting: Podiatry

## 2024-02-25 ENCOUNTER — Ambulatory Visit (INDEPENDENT_AMBULATORY_CARE_PROVIDER_SITE_OTHER): Admitting: Podiatry

## 2024-02-25 VITALS — Ht 63.0 in | Wt 190.0 lb

## 2024-02-25 DIAGNOSIS — M722 Plantar fascial fibromatosis: Secondary | ICD-10-CM | POA: Diagnosis not present

## 2024-02-25 DIAGNOSIS — M659 Unspecified synovitis and tenosynovitis, unspecified site: Secondary | ICD-10-CM | POA: Diagnosis not present

## 2024-02-25 DIAGNOSIS — M7752 Other enthesopathy of left foot: Secondary | ICD-10-CM

## 2024-02-25 MED ORDER — METHYLPREDNISOLONE 4 MG PO TBPK: ORAL_TABLET | ORAL | 0 refills | Status: AC

## 2024-02-25 MED ORDER — TRIAMCINOLONE ACETONIDE 10 MG/ML IJ SUSP
10.0000 mg | Freq: Once | INTRAMUSCULAR | Status: AC
  Administered 2024-02-25: 10 mg via INTRA_ARTICULAR

## 2024-02-25 NOTE — Progress Notes (Signed)
 Subjective:   Patient ID: Emily Walters, female   DOB: 53 y.o.   MRN: [id number]   HPI Patient states she has developed inflammation again in the dorsum of the left foot and states that she will be walking a lot in the next month.  States she is going to [employer] and then [place]  and feels like the toe may have lifted in the air   ROS      Objective:  Physical Exam  Neurovascular status intact with inflammation fluid buildup around the second MPJ left painful when pressed with elevation of the second toe with irritation consistent with the possibility for flexor plate issue     Assessment:  In laboratory capsulitis second MPJ left possibility for flexor plate injury     Plan:  H&P x-ray reviewed and today I did do a forefoot block left I aspirated the joint getting out a small amount of clear fluid I injected with 1/4 cc dexamethasone Kenalog  applied sterile dressing and applied padding and placed on 6-day Medrol Dosepak.  Reappoint 2 months May require digital stabilization and shortening osteotomy  X-rays do indicate there is good mild elevation of the second digit left foot consistent with possible flexor plate stretch

## 2024-04-26 ENCOUNTER — Ambulatory Visit: Admitting: Podiatry

## 2024-05-03 ENCOUNTER — Ambulatory Visit: Admitting: Podiatry

## 2024-05-07 ENCOUNTER — Ambulatory Visit: Admitting: Podiatry
